# Patient Record
Sex: Female | Born: 1990 | Race: Black or African American | Hispanic: No | Smoking: Never smoker
Health system: Southern US, Community
[De-identification: ages and names within clinical notes are randomized; demographics above are authoritative.]

## PROBLEM LIST (undated history)

## (undated) DIAGNOSIS — D352 Benign neoplasm of pituitary gland: Secondary | ICD-10-CM

## (undated) DIAGNOSIS — F419 Anxiety disorder, unspecified: Secondary | ICD-10-CM

## (undated) HISTORY — DX: Benign neoplasm of pituitary gland: D35.2

## (undated) HISTORY — PX: OTHER SURGICAL HISTORY: SHX169

---

## 2015-09-02 ENCOUNTER — Other Ambulatory Visit: Payer: Self-pay | Admitting: Internal Medicine

## 2015-09-02 DIAGNOSIS — E221 Hyperprolactinemia: Secondary | ICD-10-CM

## 2015-09-08 ENCOUNTER — Other Ambulatory Visit: Payer: Self-pay | Admitting: Internal Medicine

## 2015-09-09 ENCOUNTER — Ambulatory Visit
Admission: RE | Admit: 2015-09-09 | Discharge: 2015-09-09 | Disposition: A | Discharge status: Home / Self Care | Payer: BLUE CROSS/BLUE SHIELD | Source: Ambulatory Visit | Attending: Internal Medicine | Admitting: Internal Medicine

## 2015-09-09 DIAGNOSIS — G939 Disorder of brain, unspecified: Secondary | ICD-10-CM | POA: Diagnosis not present

## 2015-09-09 DIAGNOSIS — E221 Hyperprolactinemia: Secondary | ICD-10-CM | POA: Diagnosis not present

## 2015-09-09 MED ORDER — GADOBENATE DIMEGLUMINE 529 MG/ML IV SOLN
10.0000 mL | Freq: Once | INTRAVENOUS | Status: AC | PRN
  Administered 2015-09-09: 10 mL via INTRAVENOUS

## 2016-12-02 ENCOUNTER — Other Ambulatory Visit: Payer: Self-pay | Admitting: Internal Medicine

## 2016-12-02 DIAGNOSIS — D352 Benign neoplasm of pituitary gland: Secondary | ICD-10-CM

## 2016-12-17 ENCOUNTER — Encounter: Payer: Self-pay | Admitting: Radiology

## 2016-12-17 ENCOUNTER — Ambulatory Visit
Admission: RE | Admit: 2016-12-17 | Discharge: 2016-12-17 | Disposition: A | Discharge status: Home / Self Care | Payer: BLUE CROSS/BLUE SHIELD | Source: Ambulatory Visit | Attending: Internal Medicine | Admitting: Internal Medicine

## 2016-12-17 DIAGNOSIS — D352 Benign neoplasm of pituitary gland: Secondary | ICD-10-CM | POA: Diagnosis not present

## 2016-12-17 MED ORDER — GADOBENATE DIMEGLUMINE 529 MG/ML IV SOLN
10.0000 mL | Freq: Once | INTRAVENOUS | Status: AC | PRN
  Administered 2016-12-17: 10 mL via INTRAVENOUS

## 2018-01-06 ENCOUNTER — Encounter: Payer: Self-pay | Admitting: Nurse Practitioner

## 2018-01-06 ENCOUNTER — Ambulatory Visit (INDEPENDENT_AMBULATORY_CARE_PROVIDER_SITE_OTHER): Payer: 59 | Admitting: Nurse Practitioner

## 2018-01-06 VITALS — BP 130/91 | HR 82 | Resp 16 | Ht 65.0 in | Wt 217.8 lb

## 2018-01-06 DIAGNOSIS — E559 Vitamin D deficiency, unspecified: Secondary | ICD-10-CM

## 2018-01-06 DIAGNOSIS — H539 Unspecified visual disturbance: Secondary | ICD-10-CM

## 2018-01-06 DIAGNOSIS — D352 Benign neoplasm of pituitary gland: Secondary | ICD-10-CM

## 2018-01-06 DIAGNOSIS — R5383 Other fatigue: Secondary | ICD-10-CM

## 2018-01-06 NOTE — Progress Notes (Addendum)
[facility] [place], [place] [address]  Internal MEDICINE  Office Visit Note  Patient Name: Lynn Oneal  [id number]  [id number]  Date of Service: 01/18/2018  Chief Complaint  Patient presents with  . Fatigue    not excessive  . Blurred Vision    left eye thinks it might be due to hunger and dehydration; can feel unfocused    Patient is here for acute visit. She admits to having some blurry vision. Happening to 2 or 3 times per day, generally when hungry. She has also noted increased thirst. She denies nausea or vomiting. States that blurry vision goes away after she eats or drinks something.    Other  This is a new problem. The current episode started in the past 7 days. The problem occurs 2 to 4 times per day. The problem has been unchanged. Associated symptoms include fatigue, headaches and a visual change. Pertinent negatives include no abdominal pain, arthralgias, chest pain, chills, congestion, coughing, joint swelling, nausea, neck pain, numbness, rash, sore throat or vomiting. Associated symptoms comments: Increased appetite and fatigue. Exacerbated by: being hungry. She has tried nothing for the symptoms. The treatment provided no relief.    Pt is here for routine follow up.    Current Medication: Outpatient Encounter Medications as of 01/06/2018  Medication Sig  . cabergoline (DOSTINEX) 0.5 MG tablet Take 0.25 mg by mouth 2 (two) times a week.  . norethindrone-ethinyl estradiol-iron (MICROGESTIN FE,GILDESS FE,LOESTRIN FE) 1.5-30 MG-MCG tablet Take 1 tablet by mouth daily.   No facility-administered encounter medications on file as of 01/06/2018.     Surgical History: History reviewed. No pertinent surgical history.  Medical History: Past Medical History:  Diagnosis Date  . Prolactinoma ([place])     Family History: Family History  Problem Relation Age of Onset  . Hypertension Mother   . Hyperlipidemia Mother   . Hypertension Father   .  Hyperlipidemia Father   . Heart attack Father   . Prostate cancer Father   . Diabetes Brother     Social History   Socioeconomic History  . Marital status: Unknown    Spouse name: Not on file  . Number of children: Not on file  . Years of education: Not on file  . Highest education level: Not on file  Social Needs  . Financial resource strain: Not on file  . Food insecurity - worry: Not on file  . Food insecurity - inability: Not on file  . Transportation needs - medical: Not on file  . Transportation needs - non-medical: Not on file  Occupational History  . Not on file  Tobacco Use  . Smoking status: Never Smoker  . Smokeless tobacco: Never Used  Substance and Sexual Activity  . Alcohol use: No    Frequency: Never  . Drug use: No  . Sexual activity: Yes    Birth control/protection: Pill  Other Topics Concern  . Not on file  Social History Narrative  . Not on file      Review of Systems  Constitutional: Positive for fatigue. Negative for chills and unexpected weight change.  HENT: Negative for congestion, postnasal drip, rhinorrhea, sneezing and sore throat.   Eyes: Positive for visual disturbance. Negative for redness.       Intermittent episodes of blurred vision, happening mostly at night  Respiratory: Negative for cough, chest tightness and shortness of breath.   Cardiovascular: Negative for chest pain and palpitations.  Gastrointestinal: Negative for abdominal pain, constipation,  diarrhea, nausea and vomiting.  Endocrine: Negative for cold intolerance, heat intolerance, polydipsia, polyphagia and polyuria.  Genitourinary: Negative for dysuria and frequency.  Musculoskeletal: Negative for arthralgias, back pain, joint swelling and neck pain.  Skin: Negative for rash.  Allergic/Immunologic: Negative for environmental allergies.  Neurological: Positive for headaches. Negative for tremors and numbness.  Hematological: Negative for adenopathy. Does not  bruise/bleed easily.  Psychiatric/Behavioral: Negative for agitation and suicidal ideas. Behavioral problem: Depression.    Today's Vitals   01/06/18 1634  BP: (!) 130/91  Pulse: 82  Resp: 16  SpO2: 100%  Weight: 217 lb 12.8 oz (98.8 kg)  Height: 5\' 5"  (1.651 m)    Physical Exam  Constitutional: She is oriented to person, place, and time. She appears well-developed and well-nourished. No distress.  HENT:  Head: Normocephalic and atraumatic.  Mouth/Throat: Oropharynx is clear and moist. No oropharyngeal exudate.  Eyes: Conjunctivae and EOM are normal. Pupils are equal, round, and reactive to light. Right eye exhibits no discharge. Left eye exhibits no discharge. No scleral icterus.  Neck: Normal range of motion. Neck supple. No JVD present. No tracheal deviation present. No thyromegaly present.  Cardiovascular: Normal rate, regular rhythm and normal heart sounds. Exam reveals no gallop and no friction rub.  No murmur heard. Pulmonary/Chest: Effort normal and breath sounds normal. No respiratory distress. She has no wheezes. She has no rales. She exhibits no tenderness.  Abdominal: Soft. Bowel sounds are normal. There is no tenderness.  Musculoskeletal: Normal range of motion.  Lymphadenopathy:    She has no cervical adenopathy.  Neurological: She is alert and oriented to person, place, and time. No cranial nerve deficit.  Skin: Skin is warm and dry. She is not diaphoretic.  Psychiatric: She has a normal mood and affect. Her behavior is normal. Judgment and thought content normal.  Nursing note and vitals reviewed.   Assessment/Plan: 1. 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 Share results with patient's neurologist - TSH + free T4 - Prolactin  2. Other fatigue Check labs  - CBC With Differential - TSH + free T4 - Iron, TIBC and Ferritin Panel - Vitamin B12 - Hemoglobin A1c  3. Visual disturbance - Comprehensive metabolic panel - Iron, TIBC and Ferritin  Panel - Hemoglobin A1c Recommend visit with eye doctor.  4. Vitamin D deficiency - Vitamin D 1,25 dihydroxy   General Counseling: [name] verbalizes understanding of the findings of todays visit and agrees with plan of treatment. I have discussed any further diagnostic evaluation that may be needed or ordered today. We also reviewed her medications today. she has been encouraged to call the office with any questions or concerns that should arise related to todays visit.  This patient was seen by Leretha Pol, FNP- C in Collaboration with Dr Lavera Guise as a part of collaborative care agreement    Orders Placed This Encounter  Procedures  . CBC With Differential  . Comprehensive metabolic panel  . TSH + free T4  . Iron, TIBC and Ferritin Panel  . Vitamin B12  . Vitamin D 1,25 dihydroxy  . Hemoglobin A1c  . Prolactin      Time spent: [facility]

## 2018-01-16 LAB — IRON,TIBC AND FERRITIN PANEL
FERRITIN: 27 ng/mL (ref 15–150)
Iron Saturation: 26 % (ref 15–55)
Iron: 82 ug/dL (ref 27–159)
TIBC: 320 ug/dL (ref 250–450)
UIBC: 238 ug/dL (ref 131–425)

## 2018-01-16 LAB — COMPREHENSIVE METABOLIC PANEL
A/G RATIO: 1.7 (ref 1.2–2.2)
ALK PHOS: 54 IU/L (ref 39–117)
ALT: 21 IU/L (ref 0–32)
AST: 35 IU/L (ref 0–40)
Albumin: 3.9 g/dL (ref 3.5–5.5)
BILIRUBIN TOTAL: 0.2 mg/dL (ref 0.0–1.2)
BUN/Creatinine Ratio: 8 — ABNORMAL LOW (ref 9–23)
BUN: 7 mg/dL (ref 6–20)
CHLORIDE: 105 mmol/L (ref 96–106)
CO2: 20 mmol/L (ref 20–29)
Calcium: 8.9 mg/dL (ref 8.7–10.2)
Creatinine, Ser: 0.87 mg/dL (ref 0.57–1.00)
GFR calc non Af Amer: 91 mL/min/{1.73_m2} (ref >59)
GFR, EST AFRICAN AMERICAN: 105 mL/min/{1.73_m2} (ref >59)
GLUCOSE: 110 mg/dL — AB (ref 65–99)
Globulin, Total: 2.3 g/dL (ref 1.5–4.5)
POTASSIUM: 4.8 mmol/L (ref 3.5–5.2)
Sodium: 137 mmol/L (ref 134–144)
TOTAL PROTEIN: 6.2 g/dL (ref 6.0–8.5)

## 2018-01-16 LAB — CBC WITH DIFFERENTIAL
BASOS ABS: 0 10*3/uL (ref 0.0–0.2)
Basos: 0 %
EOS (ABSOLUTE): 0.2 10*3/uL (ref 0.0–0.4)
EOS: 3 %
HEMATOCRIT: 38.9 % (ref 34.0–46.6)
HEMOGLOBIN: 13.3 g/dL (ref 11.1–15.9)
Immature Grans (Abs): 0 10*3/uL (ref 0.0–0.1)
Immature Granulocytes: 0 %
LYMPHS ABS: 2.3 10*3/uL (ref 0.7–3.1)
Lymphs: 34 %
MCH: 27.6 pg (ref 26.6–33.0)
MCHC: 34.2 g/dL (ref 31.5–35.7)
MCV: 81 fL (ref 79–97)
MONOCYTES: 6 %
MONOS ABS: 0.4 10*3/uL (ref 0.1–0.9)
Neutrophils Absolute: 3.9 10*3/uL (ref 1.4–7.0)
Neutrophils: 57 %
RBC: 4.82 x10E6/uL (ref 3.77–5.28)
RDW: 13.2 % (ref 12.3–15.4)
WBC: 6.9 10*3/uL (ref 3.4–10.8)

## 2018-01-16 LAB — VITAMIN D 1,25 DIHYDROXY
VITAMIN D2 1, 25 (OH): 13 pg/mL
VITAMIN D3 1, 25 (OH): 36 pg/mL
Vitamin D 1, 25 (OH)2 Total: 49 pg/mL

## 2018-01-16 LAB — VITAMIN B12: VITAMIN B 12: 210 pg/mL — AB (ref 232–1245)

## 2018-01-16 LAB — TSH+FREE T4
Free T4: 1.19 ng/dL (ref 0.82–1.77)
TSH: 1.27 u[IU]/mL (ref 0.450–4.500)

## 2018-01-16 LAB — PROLACTIN: Prolactin: 9.8 ng/mL (ref 4.8–23.3)

## 2018-01-16 LAB — HEMOGLOBIN A1C
Est. average glucose Bld gHb Est-mCnc: 123 mg/dL
HEMOGLOBIN A1C: 5.9 % — AB (ref 4.8–5.6)

## 2018-01-18 DIAGNOSIS — E559 Vitamin D deficiency, unspecified: Secondary | ICD-10-CM | POA: Insufficient documentation

## 2018-01-18 DIAGNOSIS — D352 Benign neoplasm of pituitary gland: Secondary | ICD-10-CM | POA: Insufficient documentation

## 2018-01-18 DIAGNOSIS — H539 Unspecified visual disturbance: Secondary | ICD-10-CM | POA: Insufficient documentation

## 2018-01-18 DIAGNOSIS — R5383 Other fatigue: Secondary | ICD-10-CM | POA: Insufficient documentation

## 2018-01-19 ENCOUNTER — Ambulatory Visit: Payer: Self-pay

## 2018-01-20 ENCOUNTER — Ambulatory Visit (INDEPENDENT_AMBULATORY_CARE_PROVIDER_SITE_OTHER): Payer: 59

## 2018-01-20 DIAGNOSIS — E538 Deficiency of other specified B group vitamins: Secondary | ICD-10-CM

## 2018-01-20 MED ORDER — CYANOCOBALAMIN 1000 MCG/ML IJ SOLN
1000.0000 ug | Freq: Once | INTRAMUSCULAR | Status: AC
  Administered 2018-01-20: 1000 ug via INTRAMUSCULAR

## 2018-01-20 NOTE — Progress Notes (Signed)
b12

## 2018-01-26 ENCOUNTER — Ambulatory Visit: Payer: 59

## 2018-01-26 DIAGNOSIS — E538 Deficiency of other specified B group vitamins: Secondary | ICD-10-CM

## 2018-01-26 MED ORDER — CYANOCOBALAMIN 1000 MCG/ML IJ SOLN
1000.0000 ug | Freq: Once | INTRAMUSCULAR | Status: AC
  Administered 2018-01-26: 1000 ug via INTRAMUSCULAR

## 2018-01-27 ENCOUNTER — Ambulatory Visit: Payer: Self-pay

## 2018-02-01 ENCOUNTER — Ambulatory Visit (INDEPENDENT_AMBULATORY_CARE_PROVIDER_SITE_OTHER): Payer: 59

## 2018-02-01 DIAGNOSIS — E538 Deficiency of other specified B group vitamins: Secondary | ICD-10-CM | POA: Diagnosis not present

## 2018-02-01 MED ORDER — CYANOCOBALAMIN 1000 MCG/ML IJ SOLN
1000.0000 ug | Freq: Once | INTRAMUSCULAR | Status: AC
Start: 2018-02-01 — End: 2018-02-01
  Administered 2018-02-01: 1000 ug via INTRAMUSCULAR

## 2018-02-08 ENCOUNTER — Ambulatory Visit (INDEPENDENT_AMBULATORY_CARE_PROVIDER_SITE_OTHER): Payer: 59

## 2018-02-08 DIAGNOSIS — E538 Deficiency of other specified B group vitamins: Secondary | ICD-10-CM | POA: Diagnosis not present

## 2018-02-08 MED ORDER — CYANOCOBALAMIN 1000 MCG/ML IJ SOLN
1000.0000 ug | Freq: Once | INTRAMUSCULAR | Status: AC
Start: 2018-02-08 — End: 2018-02-08
  Administered 2018-02-08: 1000 ug via INTRAMUSCULAR

## 2018-03-08 ENCOUNTER — Ambulatory Visit: Payer: Self-pay

## 2018-04-14 ENCOUNTER — Ambulatory Visit (INDEPENDENT_AMBULATORY_CARE_PROVIDER_SITE_OTHER): Payer: 59

## 2018-04-14 DIAGNOSIS — E538 Deficiency of other specified B group vitamins: Secondary | ICD-10-CM | POA: Diagnosis not present

## 2018-04-14 MED ORDER — CYANOCOBALAMIN 1000 MCG/ML IJ SOLN
1000.0000 ug | Freq: Once | INTRAMUSCULAR | Status: AC
  Administered 2018-04-14: 1000 ug via INTRAMUSCULAR

## 2018-05-17 ENCOUNTER — Ambulatory Visit: Payer: Self-pay

## 2018-05-22 ENCOUNTER — Ambulatory Visit (INDEPENDENT_AMBULATORY_CARE_PROVIDER_SITE_OTHER): Payer: 59

## 2018-05-22 DIAGNOSIS — E538 Deficiency of other specified B group vitamins: Secondary | ICD-10-CM | POA: Diagnosis not present

## 2018-05-22 MED ORDER — CYANOCOBALAMIN 1000 MCG/ML IJ SOLN
1000.0000 ug | Freq: Once | INTRAMUSCULAR | Status: AC
  Administered 2018-05-22: 1000 ug via INTRAMUSCULAR

## 2018-06-13 ENCOUNTER — Encounter: Payer: Self-pay | Admitting: Nurse Practitioner

## 2018-06-13 ENCOUNTER — Ambulatory Visit (INDEPENDENT_AMBULATORY_CARE_PROVIDER_SITE_OTHER): Payer: 59 | Admitting: Nurse Practitioner

## 2018-06-13 VITALS — BP 140/80 | HR 77 | Resp 16 | Ht 65.0 in | Wt 230.0 lb

## 2018-06-13 DIAGNOSIS — Z0001 Encounter for general adult medical examination with abnormal findings: Secondary | ICD-10-CM | POA: Insufficient documentation

## 2018-06-13 DIAGNOSIS — R5383 Other fatigue: Secondary | ICD-10-CM

## 2018-06-13 DIAGNOSIS — Z30011 Encounter for initial prescription of contraceptive pills: Secondary | ICD-10-CM | POA: Diagnosis not present

## 2018-06-13 MED ORDER — NORETHIN ACE-ETH ESTRAD-FE 1.5-30 MG-MCG PO TABS: 1.0000 | ORAL_TABLET | Freq: Every day | ORAL | 11 refills | Status: DC

## 2018-06-13 NOTE — Progress Notes (Signed)
[facility] [place], [place] [address]  Internal MEDICINE  Office Visit Note  Patient Name: Lynn Oneal  [id number]  [id number]  Date of Service: 06/13/2018    Pt is here for routine follow up.   Chief Complaint  Patient presents with  . Other    follow up    The patient states that she needs to have refills of birth control pills. Sees endocrinology on regular basis and told her she needed to see her primary care provider for this prescriptions. She did have annual wellness visit and pap smear in 10/2017 and had normal pap results. Today, she complains of weight gain and fatigue. She has no other concerns or complaints.      Current Medication: Outpatient Encounter Medications as of 06/13/2018  Medication Sig  . cabergoline (DOSTINEX) 0.5 MG tablet Take 0.25 mg by mouth 2 (two) times a week.  . norethindrone-ethinyl estradiol-iron (MICROGESTIN FE,GILDESS FE,LOESTRIN FE) 1.5-30 MG-MCG tablet Take 1 tablet by mouth daily.  . [DISCONTINUED] norethindrone-ethinyl estradiol-iron (MICROGESTIN FE,GILDESS FE,LOESTRIN FE) 1.5-30 MG-MCG tablet Take 1 tablet by mouth daily.   No facility-administered encounter medications on file as of 06/13/2018.     Surgical History: Past Surgical History:  Procedure Laterality Date  . NO PRIOR SURGERY      Medical History: Past Medical History:  Diagnosis Date  . Prolactinoma ([place])     Family History: Family History  Problem Relation Age of Onset  . Hypertension Mother   . Hyperlipidemia Mother   . Hypertension Father   . Hyperlipidemia Father   . Heart attack Father   . Prostate cancer Father   . Diabetes Brother     Social History   Socioeconomic History  . Marital status: Unknown    Spouse name: Not on file  . Number of children: Not on file  . Years of education: Not on file  . Highest education level: Not on file  Occupational History  . Not on file  Social Needs  . Financial resource strain:  Not on file  . Food insecurity:    Worry: Not on file    Inability: Not on file  . Transportation needs:    Medical: Not on file    Non-medical: Not on file  Tobacco Use  . Smoking status: Never Smoker  . Smokeless tobacco: Never Used  Substance and Sexual Activity  . Alcohol use: No    Frequency: Never  . Drug use: No  . Sexual activity: Yes    Birth control/protection: Pill  Lifestyle  . Physical activity:    Days per week: Not on file    Minutes per session: Not on file  . Stress: Not on file  Relationships  . Social connections:    Talks on phone: Not on file    Gets together: Not on file    Attends religious service: Not on file    Active member of club or organization: Not on file    Attends meetings of clubs or organizations: Not on file    Relationship status: Not on file  . Intimate partner violence:    Fear of current or ex partner: Not on file    Emotionally abused: Not on file    Physically abused: Not on file    Forced sexual activity: Not on file  Other Topics Concern  . Not on file  Social History Narrative  . Not on file      Review of Systems  Constitutional:  Positive for fatigue and unexpected weight change. Negative for chills.  HENT: Negative for congestion, postnasal drip, rhinorrhea, sneezing and sore throat.   Eyes: Negative for redness and visual disturbance.  Respiratory: Negative for cough, chest tightness and shortness of breath.   Cardiovascular: Negative for chest pain and palpitations.  Gastrointestinal: Negative for abdominal pain, constipation, diarrhea, nausea and vomiting.  Endocrine: Negative for cold intolerance, heat intolerance, polydipsia, polyphagia and polyuria.  Genitourinary: Negative for dysuria and frequency.  Musculoskeletal: Negative for arthralgias, back pain, joint swelling, myalgias and neck pain.  Skin: Negative for rash.  Allergic/Immunologic: Negative for environmental allergies.  Neurological: Positive for  headaches. Negative for tremors and numbness.  Hematological: Negative for adenopathy. Does not bruise/bleed easily.  Psychiatric/Behavioral: Negative for agitation, dysphoric mood and suicidal ideas. Behavioral problem: Depression. The patient is not nervous/anxious.    Today's Vitals   06/13/18 0917  BP: 140/80  Pulse: 77  Resp: 16  SpO2: 97%  Weight: 230 lb (104.3 kg)  Height: 5\' 5"  (1.651 m)    Physical Exam  Constitutional: She is oriented to person, place, and time. She appears well-developed and well-nourished. No distress.  HENT:  Head: Normocephalic and atraumatic.  Mouth/Throat: Oropharynx is clear and moist. No oropharyngeal exudate.  Eyes: Pupils are equal, round, and reactive to light. Conjunctivae and EOM are normal. Right eye exhibits no discharge. Left eye exhibits no discharge. No scleral icterus.  Neck: Normal range of motion. Neck supple. No JVD present. No tracheal deviation present. No thyromegaly present.  Cardiovascular: Normal rate, regular rhythm and normal heart sounds. Exam reveals no gallop and no friction rub.  No murmur heard. Pulmonary/Chest: Effort normal and breath sounds normal. No respiratory distress. She has no wheezes. She has no rales. She exhibits no tenderness.  Abdominal: Soft. Bowel sounds are normal. There is no tenderness.  Musculoskeletal: Normal range of motion.  Lymphadenopathy:    She has no cervical adenopathy.  Neurological: She is alert and oriented to person, place, and time. No cranial nerve deficit.  Skin: Skin is warm and dry. She is not diaphoretic.  Psychiatric: She has a normal mood and affect. Her behavior is normal. Judgment and thought content normal.  Nursing note and vitals reviewed.   Assessment/Plan:  1. Other fatigue Recommend B12 supplementation as before. Discussed healthy diet and routine exercise to help increase energy levels.   2. Encounter for prescription of oral contraceptives - norethindrone-ethinyl  estradiol-iron (MICROGESTIN FE,GILDESS FE,LOESTRIN FE) 1.5-30 MG-MCG tablet; Take 1 tablet by mouth daily.  Dispense: 1 Package; Refill: 11   General Counseling: Lynn Oneal verbalizes understanding of the findings of todays visit and agrees with plan of treatment. I have discussed any further diagnostic evaluation that may be needed or ordered today. We also reviewed her medications today. she has been encouraged to call the office with any questions or concerns that should arise related to todays visit.    Counseling:  This patient was seen by Leretha Pol, FNP- C in Collaboration with Dr Lavera Guise as a part of collaborative care agreement  Meds ordered this encounter  Medications  . norethindrone-ethinyl estradiol-iron (MICROGESTIN FE,GILDESS FE,LOESTRIN FE) 1.5-30 MG-MCG tablet    Sig: Take 1 tablet by mouth daily.    Dispense:  1 Package    Refill:  11    Order Specific Question:   Supervising Provider    Answer:   Lavera Guise [[id number]]    Time spent: 87 Minutes     Dr Timoteo Gaul  [facility] Internal medicine

## 2018-06-23 ENCOUNTER — Encounter (INDEPENDENT_AMBULATORY_CARE_PROVIDER_SITE_OTHER): Payer: Self-pay

## 2018-06-23 ENCOUNTER — Ambulatory Visit (INDEPENDENT_AMBULATORY_CARE_PROVIDER_SITE_OTHER): Payer: 59

## 2018-06-23 DIAGNOSIS — E538 Deficiency of other specified B group vitamins: Secondary | ICD-10-CM

## 2018-06-23 MED ORDER — CYANOCOBALAMIN 1000 MCG/ML IJ SOLN
1000.0000 ug | Freq: Once | INTRAMUSCULAR | Status: AC
  Administered 2018-06-23: 1000 ug via INTRAMUSCULAR

## 2018-07-24 ENCOUNTER — Ambulatory Visit (INDEPENDENT_AMBULATORY_CARE_PROVIDER_SITE_OTHER): Payer: 59

## 2018-07-24 DIAGNOSIS — E538 Deficiency of other specified B group vitamins: Secondary | ICD-10-CM | POA: Diagnosis not present

## 2018-07-24 MED ORDER — CYANOCOBALAMIN 1000 MCG/ML IJ SOLN
1000.0000 ug | Freq: Once | INTRAMUSCULAR | Status: AC
  Administered 2018-07-24: 1000 ug via INTRAMUSCULAR

## 2018-08-24 ENCOUNTER — Encounter (INDEPENDENT_AMBULATORY_CARE_PROVIDER_SITE_OTHER): Payer: Self-pay

## 2018-08-24 ENCOUNTER — Ambulatory Visit (INDEPENDENT_AMBULATORY_CARE_PROVIDER_SITE_OTHER): Payer: 59

## 2018-08-24 DIAGNOSIS — E538 Deficiency of other specified B group vitamins: Secondary | ICD-10-CM | POA: Diagnosis not present

## 2018-08-24 MED ORDER — CYANOCOBALAMIN 1000 MCG/ML IJ SOLN
1000.0000 ug | Freq: Once | INTRAMUSCULAR | Status: AC
  Administered 2018-08-24: 1000 ug via INTRAMUSCULAR

## 2018-09-25 ENCOUNTER — Ambulatory Visit (INDEPENDENT_AMBULATORY_CARE_PROVIDER_SITE_OTHER): Payer: 59

## 2018-09-25 DIAGNOSIS — E538 Deficiency of other specified B group vitamins: Secondary | ICD-10-CM | POA: Diagnosis not present

## 2018-09-25 MED ORDER — CYANOCOBALAMIN 1000 MCG/ML IJ SOLN
1000.0000 ug | Freq: Once | INTRAMUSCULAR | Status: AC
  Administered 2018-09-25: 1000 ug via INTRAMUSCULAR

## 2018-10-04 ENCOUNTER — Other Ambulatory Visit: Payer: Self-pay | Admitting: "Endocrinology

## 2018-10-04 DIAGNOSIS — D352 Benign neoplasm of pituitary gland: Secondary | ICD-10-CM

## 2018-10-26 ENCOUNTER — Ambulatory Visit: Payer: 59

## 2018-10-26 DIAGNOSIS — E538 Deficiency of other specified B group vitamins: Secondary | ICD-10-CM | POA: Diagnosis not present

## 2018-10-26 MED ORDER — CYANOCOBALAMIN 1000 MCG/ML IJ SOLN
1000.0000 ug | Freq: Once | INTRAMUSCULAR | Status: AC
  Administered 2018-10-26: 1000 ug via INTRAMUSCULAR

## 2018-11-13 ENCOUNTER — Encounter: Payer: Self-pay | Admitting: Nurse Practitioner

## 2018-11-16 ENCOUNTER — Encounter: Payer: Self-pay | Admitting: Nurse Practitioner

## 2018-11-16 ENCOUNTER — Ambulatory Visit (INDEPENDENT_AMBULATORY_CARE_PROVIDER_SITE_OTHER): Payer: 59 | Admitting: Nurse Practitioner

## 2018-11-16 VITALS — BP 123/91 | HR 60 | Resp 16 | Ht 65.0 in | Wt 244.2 lb

## 2018-11-16 DIAGNOSIS — D352 Benign neoplasm of pituitary gland: Secondary | ICD-10-CM | POA: Diagnosis not present

## 2018-11-16 DIAGNOSIS — Z0001 Encounter for general adult medical examination with abnormal findings: Secondary | ICD-10-CM

## 2018-11-16 DIAGNOSIS — E559 Vitamin D deficiency, unspecified: Secondary | ICD-10-CM | POA: Diagnosis not present

## 2018-11-16 DIAGNOSIS — Z30011 Encounter for initial prescription of contraceptive pills: Secondary | ICD-10-CM | POA: Diagnosis not present

## 2018-11-16 MED ORDER — NORETHIN ACE-ETH ESTRAD-FE 1.5-30 MG-MCG PO TABS: 1.0000 | ORAL_TABLET | Freq: Every day | ORAL | 3 refills | Status: AC

## 2018-11-16 NOTE — Progress Notes (Signed)
[facility] [place], [place] [address]  Internal MEDICINE  Office Visit Note  Patient Name: Lynn Oneal  [id number]  [id number]  Date of Service: 11/16/2018   Pt is here for routine health maintenance examination  Chief Complaint  Patient presents with  . Annual Exam     The patient is here for routine health maintenance exam. She has no concerns or complaints. She is due to have routine, fasting labs checked. Pap smear is up to date. She needs to have refills for her birth control pills. She continues to see endocrinology for prolactinoma. She will have MRI in the beginning of the new year.     Current Medication: Outpatient Encounter Medications as of 11/16/2018  Medication Sig  . cabergoline (DOSTINEX) 0.5 MG tablet Take 0.25 mg by mouth 2 (two) times a week.  . norethindrone-ethinyl estradiol-iron (MICROGESTIN FE,GILDESS FE,LOESTRIN FE) 1.5-30 MG-MCG tablet Take 1 tablet by mouth daily.  . Vitamin D, Ergocalciferol, (DRISDOL) 1.25 MG (50000 UT) CAPS capsule One capsule two times per month (the first and 15th of every month)  . [DISCONTINUED] norethindrone-ethinyl estradiol-iron (MICROGESTIN FE,GILDESS FE,LOESTRIN FE) 1.5-30 MG-MCG tablet Take 1 tablet by mouth daily.   No facility-administered encounter medications on file as of 11/16/2018.     Surgical History: Past Surgical History:  Procedure Laterality Date  . NO PRIOR SURGERY      Medical History: Past Medical History:  Diagnosis Date  . Prolactinoma ([place])     Family History: Family History  Problem Relation Age of Onset  . Hypertension Mother   . Hyperlipidemia Mother   . Hypertension Father   . Hyperlipidemia Father   . Heart attack Father   . Prostate cancer Father   . Diabetes Brother       Review of Systems  Constitutional: Negative for activity change, chills, fatigue and unexpected weight change.  HENT: Negative for congestion, postnasal drip, rhinorrhea,  sneezing and sore throat.   Respiratory: Negative for cough, chest tightness, shortness of breath and wheezing.   Cardiovascular: Negative for chest pain and palpitations.  Gastrointestinal: Negative for abdominal pain, constipation, diarrhea, nausea and vomiting.  Endocrine: Negative for cold intolerance, heat intolerance, polydipsia and polyuria.       Sees endocrinology for prolactinoma  Genitourinary: Negative for dysuria and frequency.  Musculoskeletal: Negative for arthralgias, back pain, joint swelling and neck pain.  Skin: Negative for rash.  Allergic/Immunologic: Negative for environmental allergies.  Neurological: Negative for dizziness, tremors, numbness and headaches.  Hematological: Negative for adenopathy. Does not bruise/bleed easily.  Psychiatric/Behavioral: Negative for behavioral problems (Depression), sleep disturbance and suicidal ideas. The patient is not nervous/anxious.     Today's Vitals   11/16/18 0917  BP: (!) 123/91  Pulse: 60  Resp: 16  SpO2: 98%  Weight: 244 lb 3.2 oz (110.8 kg)  Height: 5\' 5"  (1.651 m)    Physical Exam  Constitutional: She is oriented to person, place, and time. She appears well-developed and well-nourished. No distress.  HENT:  Head: Normocephalic and atraumatic.  Nose: Nose normal.  Mouth/Throat: Oropharynx is clear and moist. No oropharyngeal exudate.  Eyes: Pupils are equal, round, and reactive to light. EOM are normal.  Neck: Normal range of motion. Neck supple. No JVD present. No tracheal deviation present. No thyromegaly present.  Cardiovascular: Normal rate, regular rhythm, normal heart sounds and intact distal pulses. Exam reveals no gallop and no friction rub.  No murmur heard. Pulmonary/Chest: Effort normal and breath sounds normal. No respiratory  distress. She has no wheezes. She has no rales. She exhibits no tenderness. Right breast exhibits no inverted nipple, no mass, no skin change and no tenderness. Left breast  exhibits no inverted nipple, no mass, no nipple discharge, no skin change and no tenderness.  Abdominal: Soft. Bowel sounds are normal. There is no tenderness.  Musculoskeletal: Normal range of motion.  Lymphadenopathy:    She has no cervical adenopathy.  Neurological: She is alert and oriented to person, place, and time. No cranial nerve deficit.  Skin: Skin is warm and dry. Capillary refill takes less than 2 seconds. She is not diaphoretic.  Psychiatric: She has a normal mood and affect. Her behavior is normal. Judgment and thought content normal.  Nursing note and vitals reviewed.  Assessment/Plan: 1. Encounter for general adult medical examination with abnormal findings Annual health maintenance exam today - UA/M w/rflx Culture, Routine  2. 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 Encounter for prescription of oral contraceptives - norethindrone-ethinyl estradiol-iron (MICROGESTIN FE,GILDESS FE,LOESTRIN FE) 1.5-30 MG-MCG tablet; Take 1 tablet by mouth daily.  Dispense: 3 Package; Refill: 3  4. Vitamin D deficiency Check labs with vitamin d and treat as indicated.   General Counseling: [name] verbalizes understanding of the findings of todays visit and agrees with plan of treatment. I have discussed any further diagnostic evaluation that may be needed or ordered today. We also reviewed her medications today. she has been encouraged to call the office with any questions or concerns that should arise related to todays visit.    Counseling:  This patient was seen by Leretha Pol FNP Collaboration with Dr Lavera Guise as a part of collaborative care agreement  Orders Placed This Encounter  Procedures  . UA/M w/rflx Culture, Routine    Meds ordered this encounter  Medications  . norethindrone-ethinyl estradiol-iron (MICROGESTIN FE,GILDESS FE,LOESTRIN FE) 1.5-30 MG-MCG tablet    Sig: Take 1 tablet by mouth daily.     Dispense:  3 Package    Refill:  3    Order Specific Question:   Supervising Provider    Answer:   Lavera Guise [[id number]]    Time spent: [place], MD  Internal Medicine

## 2018-11-17 LAB — MICROSCOPIC EXAMINATION: Casts: NONE SEEN /lpf

## 2018-11-17 LAB — UA/M W/RFLX CULTURE, ROUTINE
Bilirubin, UA: NEGATIVE
GLUCOSE, UA: NEGATIVE
KETONES UA: NEGATIVE
LEUKOCYTES UA: NEGATIVE
Nitrite, UA: NEGATIVE
PROTEIN UA: NEGATIVE
RBC, UA: NEGATIVE
SPEC GRAV UA: 1.017 (ref 1.005–1.030)
UUROB: 1 mg/dL (ref 0.2–1.0)
pH, UA: 7.5 (ref 5.0–7.5)

## 2018-11-29 ENCOUNTER — Ambulatory Visit: Payer: Self-pay

## 2018-12-04 ENCOUNTER — Ambulatory Visit: Payer: 59

## 2018-12-04 DIAGNOSIS — E538 Deficiency of other specified B group vitamins: Secondary | ICD-10-CM

## 2018-12-04 MED ORDER — CYANOCOBALAMIN 1000 MCG/ML IJ SOLN
1000.0000 ug | Freq: Once | INTRAMUSCULAR | Status: AC
  Administered 2018-12-04: 1000 ug via INTRAMUSCULAR

## 2018-12-27 ENCOUNTER — Ambulatory Visit (INDEPENDENT_AMBULATORY_CARE_PROVIDER_SITE_OTHER): Payer: BLUE CROSS/BLUE SHIELD

## 2018-12-27 DIAGNOSIS — E538 Deficiency of other specified B group vitamins: Secondary | ICD-10-CM

## 2018-12-27 MED ORDER — CYANOCOBALAMIN 1000 MCG/ML IJ SOLN
1000.0000 ug | Freq: Once | INTRAMUSCULAR | Status: AC
  Administered 2018-12-27: 1000 ug via INTRAMUSCULAR

## 2019-01-29 ENCOUNTER — Ambulatory Visit (INDEPENDENT_AMBULATORY_CARE_PROVIDER_SITE_OTHER): Payer: BLUE CROSS/BLUE SHIELD

## 2019-01-29 DIAGNOSIS — E538 Deficiency of other specified B group vitamins: Secondary | ICD-10-CM

## 2019-01-29 MED ORDER — CYANOCOBALAMIN 1000 MCG/ML IJ SOLN
1000.0000 ug | Freq: Once | INTRAMUSCULAR | Status: AC
  Administered 2019-01-29: 1000 ug via INTRAMUSCULAR

## 2019-01-29 NOTE — Progress Notes (Signed)
Pt came for B12 injection given on right deltoid and follow up in month

## 2019-02-27 ENCOUNTER — Ambulatory Visit: Payer: Self-pay

## 2019-02-28 ENCOUNTER — Ambulatory Visit: Payer: BLUE CROSS/BLUE SHIELD

## 2019-02-28 DIAGNOSIS — E538 Deficiency of other specified B group vitamins: Secondary | ICD-10-CM

## 2019-02-28 MED ORDER — CYANOCOBALAMIN 1000 MCG/ML IJ SOLN
1000.0000 ug | Freq: Once | INTRAMUSCULAR | Status: AC
  Administered 2019-02-28: 1000 ug via INTRAMUSCULAR

## 2019-04-02 ENCOUNTER — Ambulatory Visit (INDEPENDENT_AMBULATORY_CARE_PROVIDER_SITE_OTHER): Payer: BLUE CROSS/BLUE SHIELD

## 2019-04-02 ENCOUNTER — Other Ambulatory Visit: Payer: Self-pay

## 2019-04-02 DIAGNOSIS — E538 Deficiency of other specified B group vitamins: Secondary | ICD-10-CM

## 2019-04-02 MED ORDER — CYANOCOBALAMIN 1000 MCG/ML IJ SOLN
1000.0000 ug | Freq: Once | INTRAMUSCULAR | Status: AC
  Administered 2019-04-02: 14:00:00 1000 ug via INTRAMUSCULAR

## 2019-04-04 ENCOUNTER — Ambulatory Visit
Admission: RE | Admit: 2019-04-04 | Discharge: 2019-04-04 | Disposition: A | Discharge status: Home / Self Care | Payer: BLUE CROSS/BLUE SHIELD | Source: Ambulatory Visit | Attending: "Endocrinology | Admitting: "Endocrinology

## 2019-04-04 ENCOUNTER — Ambulatory Visit: Payer: BLUE CROSS/BLUE SHIELD

## 2019-04-04 ENCOUNTER — Other Ambulatory Visit: Payer: Self-pay

## 2019-04-04 DIAGNOSIS — D352 Benign neoplasm of pituitary gland: Secondary | ICD-10-CM | POA: Diagnosis present

## 2019-04-04 MED ORDER — GADOBUTROL 1 MMOL/ML IV SOLN
10.0000 mL | Freq: Once | INTRAVENOUS | Status: AC | PRN
  Administered 2019-04-04: 09:00:00 10 mL via INTRAVENOUS

## 2019-05-02 ENCOUNTER — Other Ambulatory Visit: Payer: Self-pay

## 2019-05-02 ENCOUNTER — Ambulatory Visit (INDEPENDENT_AMBULATORY_CARE_PROVIDER_SITE_OTHER): Payer: Self-pay

## 2019-05-02 DIAGNOSIS — E538 Deficiency of other specified B group vitamins: Secondary | ICD-10-CM

## 2019-05-02 MED ORDER — CYANOCOBALAMIN 1000 MCG/ML IJ SOLN
1000.0000 ug | Freq: Once | INTRAMUSCULAR | Status: AC
  Administered 2019-05-02: 10:00:00 1000 ug via INTRAMUSCULAR

## 2019-06-04 ENCOUNTER — Ambulatory Visit: Payer: Self-pay

## 2019-11-15 ENCOUNTER — Telehealth: Payer: Self-pay

## 2019-11-15 NOTE — Telephone Encounter (Signed)
LMOM FOR PATIENT TO CONFIRM AS VIRTUAL FOR OV 11-19-19.

## 2019-11-19 ENCOUNTER — Encounter: Payer: Self-pay | Admitting: Nurse Practitioner

## 2020-01-11 ENCOUNTER — Ambulatory Visit: Payer: Self-pay

## 2020-01-11 ENCOUNTER — Ambulatory Visit: Payer: BLUE CROSS/BLUE SHIELD | Attending: Internal Medicine

## 2020-01-11 DIAGNOSIS — Z20822 Contact with and (suspected) exposure to covid-19: Secondary | ICD-10-CM

## 2020-01-12 LAB — NOVEL CORONAVIRUS, NAA: SARS-CoV-2, NAA: NOT DETECTED

## 2020-12-05 ENCOUNTER — Encounter: Payer: Self-pay | Admitting: Emergency Medicine

## 2020-12-05 ENCOUNTER — Ambulatory Visit
Admission: EM | Admit: 2020-12-05 | Discharge: 2020-12-05 | Disposition: A | Discharge status: Home / Self Care | Payer: PRIVATE HEALTH INSURANCE | Attending: Physician Assistant | Admitting: Physician Assistant

## 2020-12-05 ENCOUNTER — Other Ambulatory Visit: Payer: Self-pay

## 2020-12-05 ENCOUNTER — Ambulatory Visit (INDEPENDENT_AMBULATORY_CARE_PROVIDER_SITE_OTHER): Payer: PRIVATE HEALTH INSURANCE

## 2020-12-05 DIAGNOSIS — R1084 Generalized abdominal pain: Secondary | ICD-10-CM

## 2020-12-05 HISTORY — DX: Anxiety disorder, unspecified: F41.9

## 2020-12-05 LAB — COMPREHENSIVE METABOLIC PANEL
ALT: 21 U/L (ref 0–44)
AST: 20 U/L (ref 15–41)
Albumin: 3.7 g/dL (ref 3.5–5.0)
Alkaline Phosphatase: 44 U/L (ref 38–126)
Anion gap: 10 (ref 5–15)
BUN: 7 mg/dL (ref 6–20)
CO2: 26 mmol/L (ref 22–32)
Calcium: 8.8 mg/dL — ABNORMAL LOW (ref 8.9–10.3)
Chloride: 102 mmol/L (ref 98–111)
Creatinine, Ser: 0.93 mg/dL (ref 0.44–1.00)
GFR, Estimated: >60 mL/min (ref >60)
Glucose, Bld: 94 mg/dL (ref 70–99)
Potassium: 3.5 mmol/L (ref 3.5–5.1)
Sodium: 138 mmol/L (ref 135–145)
Total Bilirubin: 0.6 mg/dL (ref 0.3–1.2)
Total Protein: 7.5 g/dL (ref 6.5–8.1)

## 2020-12-05 LAB — CBC WITH DIFFERENTIAL/PLATELET
Abs Immature Granulocytes: 0.03 10*3/uL (ref 0.00–0.07)
Basophils Absolute: 0 10*3/uL (ref 0.0–0.1)
Basophils Relative: 0 %
Eosinophils Absolute: 0.1 10*3/uL (ref 0.0–0.5)
Eosinophils Relative: 1 %
HCT: 40.9 % (ref 36.0–46.0)
Hemoglobin: 13.7 g/dL (ref 12.0–15.0)
Immature Granulocytes: 0 %
Lymphocytes Relative: 35 %
Lymphs Abs: 3.1 10*3/uL (ref 0.7–4.0)
MCH: 26.9 pg (ref 26.0–34.0)
MCHC: 33.5 g/dL (ref 30.0–36.0)
MCV: 80.4 fL (ref 80.0–100.0)
Monocytes Absolute: 0.4 10*3/uL (ref 0.1–1.0)
Monocytes Relative: 5 %
Neutro Abs: 5 10*3/uL (ref 1.7–7.7)
Neutrophils Relative %: 59 %
Platelets: 323 10*3/uL (ref 150–400)
RBC: 5.09 MIL/uL (ref 3.87–5.11)
RDW: 12.4 % (ref 11.5–15.5)
WBC: 8.6 10*3/uL (ref 4.0–10.5)
nRBC: 0 % (ref 0.0–0.2)

## 2020-12-05 LAB — URINALYSIS, COMPLETE (UACMP) WITH MICROSCOPIC
Bilirubin Urine: NEGATIVE
Glucose, UA: NEGATIVE mg/dL
Hgb urine dipstick: NEGATIVE
Ketones, ur: NEGATIVE mg/dL
Nitrite: NEGATIVE
Protein, ur: NEGATIVE mg/dL
Specific Gravity, Urine: 1.02 (ref 1.005–1.030)
pH: 6.5 (ref 5.0–8.0)

## 2020-12-05 LAB — PREGNANCY, URINE: Preg Test, Ur: NEGATIVE

## 2020-12-05 LAB — LIPASE, BLOOD: Lipase: 44 U/L (ref 11–51)

## 2020-12-05 MED ORDER — LIDOCAINE VISCOUS HCL 2 % MT SOLN
15.0000 mL | Freq: Once | OROMUCOSAL | Status: AC
  Administered 2020-12-05: 15 mL via ORAL

## 2020-12-05 MED ORDER — ALUM & MAG HYDROXIDE-SIMETH 200-200-20 MG/5ML PO SUSP
30.0000 mL | Freq: Once | ORAL | Status: AC
  Administered 2020-12-05: 30 mL via ORAL

## 2020-12-05 MED ORDER — SIMETHICONE 80 MG PO CHEW: 80.0000 mg | CHEWABLE_TABLET | Freq: Four times a day (QID) | ORAL | 0 refills | Status: AC | PRN

## 2020-12-05 NOTE — ED Triage Notes (Addendum)
Patient in today c/o abdominal pain x 1 day. Patient denies urinary symptoms, vaginal discharge or fever. Patient took a laxative yesterday because she thought she might be constipated. Patient has since had 4 BMs. Patient states movement makes the pain worse and lying down makes it better.

## 2020-12-05 NOTE — ED Provider Notes (Signed)
[facility]    CSN: [id number] Arrival date & time: 12/05/20  1329      History   Chief Complaint Chief Complaint  Patient presents with   Abdominal Pain    HPI Lynn Oneal is a 29 y.o. female presenting for complaints of diffuse abdominal pain, worse around the umbilical region since yesterday.  Patient states symptoms are worse when she lays on her stomach or side and with certain movements.  She has not tried anything for relief of symptoms.  She denies any other associated symptoms.  Denies any fever, fatigue, appetite changes, nausea/vomiting, diarrhea, dysuria, vaginal discharge, back pain or pelvic pain.  Patient states that she thought she was constipated so she took an over-the-counter laxative.  She says she had about 4 bowel movements yesterday but states that she does not really think it helped.  However, patient states that she thinks she is about 50% better today than she was yesterday.  Has not taken any other over-the-counter medication for symptoms.  Patient denies any history of GI problems.  She states her last menstrual period was on 11/11/2020.  Patient states that her current abdominal pain does not really feel like menstrual cramps.  Patient denies any concern for pregnancy or STIs.  She has no other complaints or concerns today.  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      Home Medications    Prior to Admission medications   Medication Sig Start Date  End Date Taking? Authorizing Provider  cabergoline (DOSTINEX) 0.5 MG tablet Take 0.25 mg by mouth 2 (two) times a week.   Yes [provider]  norethindrone-ethinyl estradiol-iron (MICROGESTIN FE,GILDESS FE,LOESTRIN FE) 1.5-30 MG-MCG tablet Take 1 tablet by mouth daily. 11/16/18  Yes Ronnell Freshwater, NP  venlafaxine XR (EFFEXOR-XR) 37.5 MG 24 hr capsule Take 1 capsule by mouth daily. 07/15/20  Yes [provider]  Vitamin D, Ergocalciferol, (DRISDOL) 1.25 MG (50000 UT) CAPS capsule One capsule two times per month (the first and 15th of every month) 10/03/18  Yes [provider]  simethicone (GAS-X) 80 MG chewable tablet Chew 1 tablet (80 mg total) by mouth every 6 (six) hours as needed for up to 5 days for flatulence. 12/05/20 12/10/20  Danton Clap, PA-C    Family History Family History  Problem Relation Age of Onset   Hypertension Mother    Hyperlipidemia Mother    Hypertension Father    Hyperlipidemia Father    Heart attack Father    Prostate cancer Father    Diabetes Brother     Social History Social History   Tobacco Use   Smoking status: Never Smoker   Smokeless tobacco: Never Used  Scientific laboratory technician Use: Never used  Substance Use Topics   Alcohol use: No   Drug use: No     Allergies   Penicillins   Review of Systems Review of Systems  Constitutional: Negative for activity change,  chills, diaphoresis, fatigue and fever.  HENT: Negative for congestion and sore throat.   Respiratory: Negative for cough and shortness of breath.   Cardiovascular: Negative for chest pain.  Gastrointestinal: Positive for abdominal pain and constipation. Negative for abdominal distention, anal bleeding, blood in stool, diarrhea, nausea, rectal pain and vomiting.  Genitourinary: Negative for difficulty urinating, dysuria, flank pain, hematuria, pelvic pain and vaginal discharge.  Musculoskeletal: Negative for arthralgias and myalgias.  Skin:  Negative for rash.  Neurological: Negative for weakness and headaches.     Physical Exam Triage Vital Signs ED Triage Vitals  Enc Vitals Group     BP 12/05/20 1432 130/77     Pulse Rate 12/05/20 1432 87     Resp 12/05/20 1432 18     Temp 12/05/20 1432 98.9 F (37.2 C)     Temp Source 12/05/20 1432 Oral     SpO2 12/05/20 1432 100 %     Weight 12/05/20 1432 240 lb (108.9 kg)     Height 12/05/20 1432 5\' 5"  (1.651 m)     Head Circumference --      Peak Flow --      Pain Score 12/05/20 1431 5     Pain Loc --      Pain Edu? --      Excl. in [place]? --    No data found.  Updated Vital Signs BP 130/77 (BP Location: Left Arm)    Pulse 87    Temp 98.9 F (37.2 C) (Oral)    Resp 18    Ht 5\' 5"  (1.651 m)    Wt 240 lb (108.9 kg)    LMP 11/11/2020 (Exact Date) Comment: neg hcg   SpO2 100%    BMI 39.94 kg/m       Physical Exam Vitals and nursing note reviewed.  Constitutional:      General: She is not in acute distress.    Appearance: Normal appearance. She is well-developed. She is obese. She is not ill-appearing or toxic-appearing.  HENT:     Head: Normocephalic and atraumatic.  Eyes:     General: No scleral icterus.       Right eye: No discharge.        Left eye: No discharge.     Conjunctiva/sclera: Conjunctivae normal.  Cardiovascular:     Rate and Rhythm: Normal rate and regular rhythm.     Heart sounds: Normal heart sounds.  Pulmonary:     Effort: Pulmonary effort is normal. No respiratory distress.     Breath sounds: Normal breath sounds.  Abdominal:     General: Bowel sounds are normal.     Palpations: Abdomen is soft.     Tenderness: There is abdominal tenderness in the right upper quadrant, epigastric area, periumbilical area and left lower quadrant. There is guarding (RUQ). There is no right CVA tenderness, left CVA tenderness or rebound. Negative signs include Murphy's sign and McBurney's sign.  Musculoskeletal:     Cervical back: Neck supple.  Skin:    General:  Skin is dry.  Neurological:     General: No focal deficit present.     Mental Status: She is alert. Mental status is at baseline.     Motor: No weakness.     Gait: Gait normal.  Psychiatric:        Mood and Affect: Mood normal.        Behavior: Behavior normal.        Thought Content: Thought content normal.  UC Treatments / Results  Labs (all labs ordered are listed, but only abnormal results are displayed) Labs Reviewed  URINALYSIS, COMPLETE (UACMP) WITH MICROSCOPIC - Abnormal; Notable for the following components:      Result Value   Leukocytes,Ua SMALL (*)    Bacteria, UA FEW (*)    All other components within normal limits  COMPREHENSIVE METABOLIC PANEL - Abnormal; Notable for the following components:   Calcium 8.8 (*)    All other components within normal limits  URINE CULTURE  PREGNANCY, URINE  CBC WITH DIFFERENTIAL/PLATELET  LIPASE, BLOOD    EKG   Radiology DG Abdomen 1 View  Result Date: 12/05/2020 CLINICAL DATA:  29 year old female with generalized abdominal pain. EXAM: ABDOMEN - 1 VIEW COMPARISON:  None. FINDINGS: The bowel gas pattern is normal. No radio-opaque calculi or other significant radiographic abnormality are seen. IMPRESSION: Negative. Electronically Signed   By: Anner Crete M.D.   On: 12/05/2020 15:21    Procedures Procedures (including critical care time)  Medications Ordered in UC Medications  alum & mag hydroxide-simeth (MAALOX/MYLANTA) 200-200-20 MG/5ML suspension 30 mL (30 mLs Oral Given 12/05/20 1520)    And  lidocaine (XYLOCAINE) 2 % viscous mouth solution 15 mL (15 mLs Oral Given 12/05/20 1520)    Initial Impression / Assessment and Plan / UC Course  I have reviewed the triage vital signs and the nursing notes.  Pertinent labs & imaging results that were available during my care of the patient were reviewed by me and considered in my medical decision making (see chart for details).   29 year old female presenting for  abdominal pain with concerns about possible constipation since yesterday.  Patient does seem to have had some relief with over-the-counter laxatives.  In the clinic, all vital signs are normal.  Patient is in no acute distress and is well-appearing obese female.  Exam significant for abdominal tenderness of the epigastric, umbilical, left lower quadrant and right upper quadrant.  Patient does have some guarding of the right upper quadrant, but has a negative Murphy sign.  Urinalysis performed today which shows small leukocytes, but otherwise normal.  We will send urine for culture and treat for  UTI if urine culture is positive.  Patient has no urinary symptoms at this time, so will wait until culture results.  Pregnancy test negative.  KUB obtained due to patient's concern for possible constipation.  CBC, CMP, and lipase ordered.  Patient given GI cocktail to see if that improves symptoms.  KUB shows no acute findings. I did independently review imaging.  CBC, CMP, and lipase all normal.   Discussed all results of the chest with patient.  Patient says she has not paid any attention to if the GI cocktail helped or not.  She denies any worsening of symptoms.  Advised patient that her symptoms could have been due to constipation that has been relieved with the laxative that she took yesterday since she is reporting feeling 50% better than she did yesterday.  Advised her that she should also be starting her menstrual period soon so some of these could be PMS type symptoms.  Advised increasing rest and fluid intake.  Advised over-the-counter Midol and have also sent Gas-X for her.  Advised bland foods until abdominal pain gets little better.  Patient states that she is looking forward to eating cake when she gets home.  Patient is reassured by work-up today.  I reviewed ED precautions for abdominal pain with patient.  There are no signs/symptoms or  concerns for acute abdomen at this time.   Final  Clinical Impressions(s) / UC Diagnoses   Final diagnoses:  Generalized abdominal pain     Discharge Instructions     We have done multiple tests today including labs to assess for the possibility of infection, gallbladder problems, pancreatitis.  Also performed an x-ray to assess for possible constipation, kidney stones, or other abnormality of abdomen detected by x-ray.  We also checked a urinalysis suspect possible UTI.  Any pregnancy test was negative.  All of your labs today were normal.  I suspect that your abdominal discomfort is likely partially related to constipation and taking the laxative does seem to have helped you since your symptoms have improved since taking it.  There is no increased stool burden on the x-ray at this point.  There are no bowel blockages.   Suspect your abdominal cramping could also be related to PMS since you are due to start your menstrual cycle any time.  I would advise over-the-counter Midol for the symptoms.  ABDOMINAL PAIN: You may take Tylenol for pain relief. Use medications as directed including antiemetics and antidiarrheal medications if suggested or prescribed. You should increase fluids and electrolytes as well as rest over these next several days. If you have any questions or concerns, or if your symptoms are not improving or if especially if they acutely worsen, please call or stop back to the clinic immediately and we will be happy to help you or go to the ER   ABDOMINAL PAIN RED FLAGS: Seek immediate further care if: symptoms remain the same or worsen over the next 3-7 days, you are unable to keep fluids down, you see blood or mucus in your stool, you vomit black or dark red material, you have a fever of 101.F or higher, you have localized and/or persistent abdominal pain      ED Prescriptions    Medication Sig Dispense Auth. Provider   simethicone (GAS-X) 80 MG chewable tablet Chew 1 tablet (80 mg total) by mouth every 6 (six) hours as needed  for up to 5 days for flatulence. 20 tablet Gretta Cool     PDMP not reviewed this encounter.   Danton Clap, PA-C 12/05/20 1610

## 2020-12-05 NOTE — Discharge Instructions (Signed)
We have done multiple tests today including labs to assess for the possibility of infection, gallbladder problems, pancreatitis.  Also performed an x-ray to assess for possible constipation, kidney stones, or other abnormality of abdomen detected by x-ray.  We also checked a urinalysis suspect possible UTI.  Any pregnancy test was negative.  All of your labs today were normal.  I suspect that your abdominal discomfort is likely partially related to constipation and taking the laxative does seem to have helped you since your symptoms have improved since taking it.  There is no increased stool burden on the x-ray at this point.  There are no bowel blockages.   Suspect your abdominal cramping could also be related to PMS since you are due to start your menstrual cycle any time.  I would advise over-the-counter Midol for the symptoms.  ABDOMINAL PAIN: You may take Tylenol for pain relief. Use medications as directed including antiemetics and antidiarrheal medications if suggested or prescribed. You should increase fluids and electrolytes as well as rest over these next several days. If you have any questions or concerns, or if your symptoms are not improving or if especially if they acutely worsen, please call or stop back to the clinic immediately and we will be happy to help you or go to the ER   ABDOMINAL PAIN RED FLAGS: Seek immediate further care if: symptoms remain the same or worsen over the next 3-7 days, you are unable to keep fluids down, you see blood or mucus in your stool, you vomit black or dark red material, you have a fever of 101.F or higher, you have localized and/or persistent abdominal pain

## 2020-12-06 LAB — URINE CULTURE

## 2021-01-15 ENCOUNTER — Telehealth: Payer: Self-pay

## 2021-01-15 NOTE — Telephone Encounter (Signed)
Mailed records to [facility]

## 2022-05-11 ENCOUNTER — Ambulatory Visit
Admission: RE | Admit: 2022-05-11 | Payer: No Typology Code available for payment source | Source: Home / Self Care | Admitting: *Deleted

## 2022-09-29 IMAGING — CR DG ABDOMEN 1V
2 series · 2 of 2 positions shown · non-contrast
Comparison: None.

CLINICAL DATA: 29-year-old female with generalized abdominal pain.

EXAM:
ABDOMEN - 1 VIEW

[abdomen kub (1 of 2)]
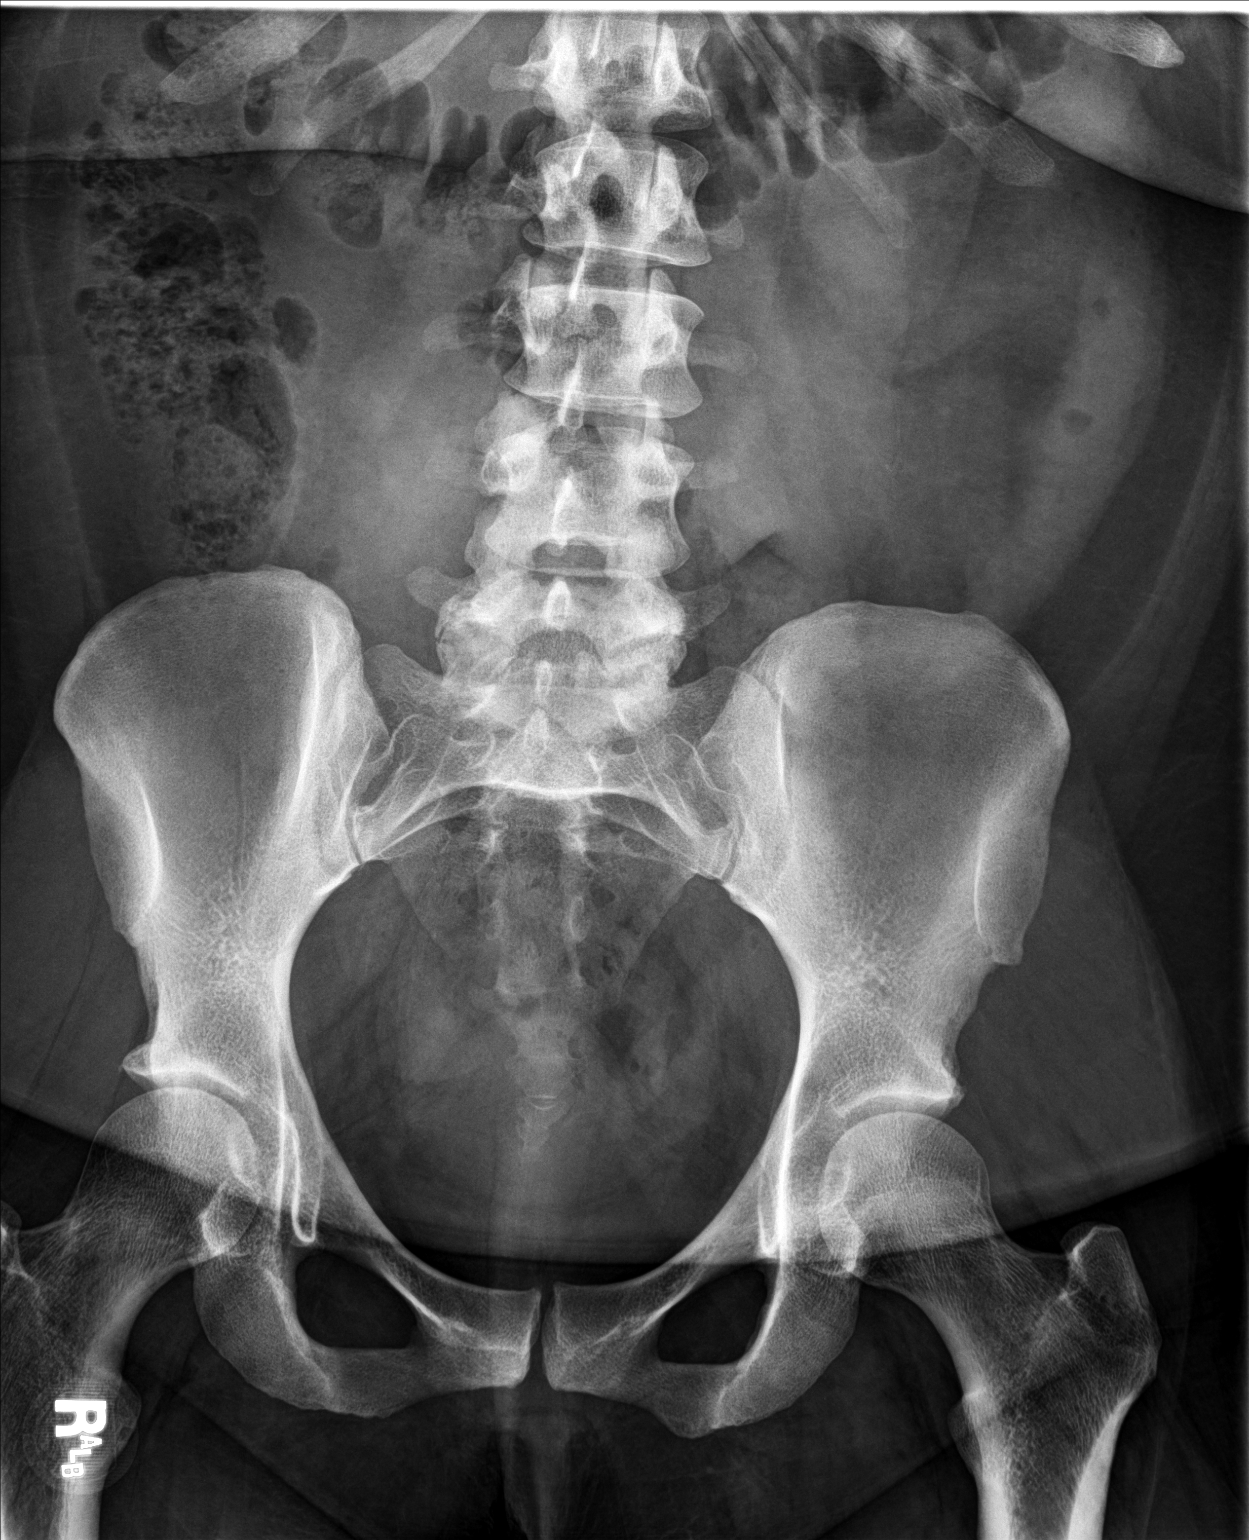

[abdomen kub (2 of 2)]
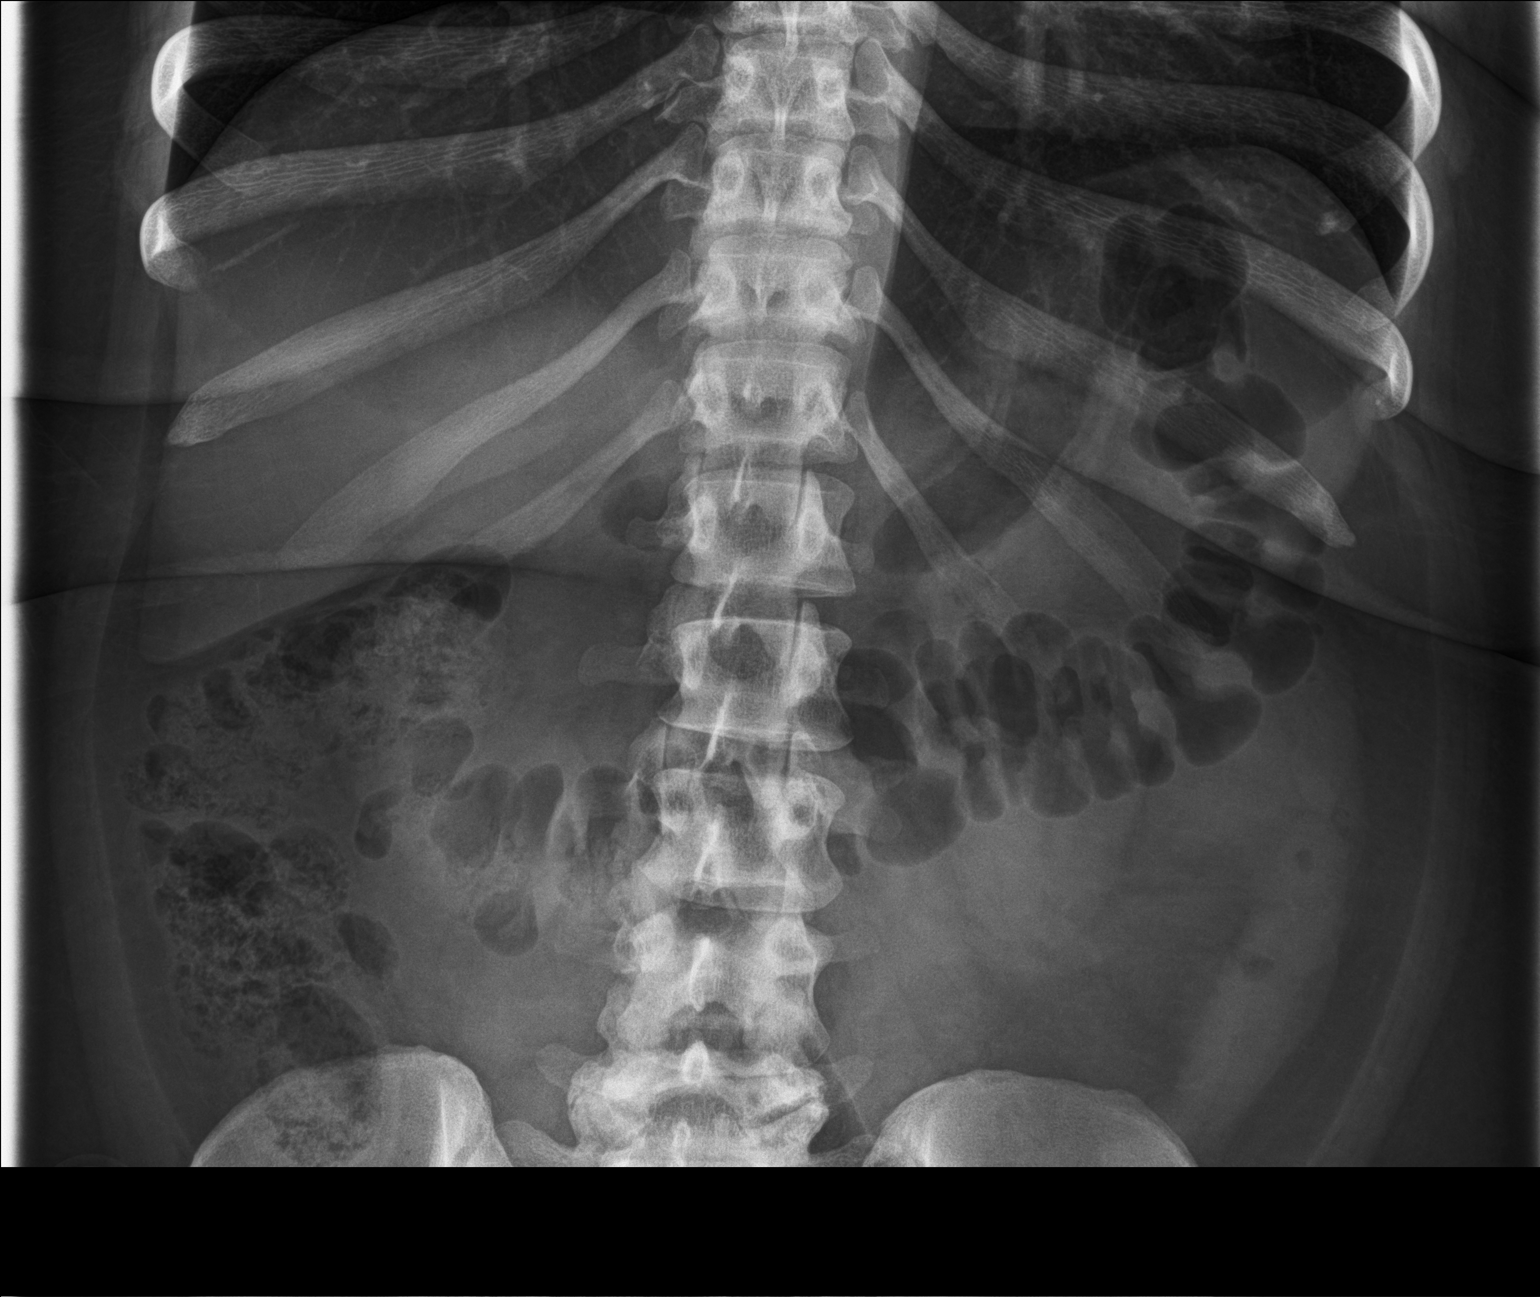

[2 of 2 positions shown; findings below may reference images not displayed]

FINDINGS: The bowel gas pattern is normal. No radio-opaque calculi or other
significant radiographic abnormality are seen.
IMPRESSION: Negative.

## 2023-11-24 ENCOUNTER — Encounter: Payer: Self-pay | Admitting: Emergency Medicine
